# Patient Record
Sex: Male | Born: 1975 | Race: Black or African American | Hispanic: No | Marital: Single | Smoking: Never smoker
Health system: Southern US, Community
[De-identification: ages and names within clinical notes are randomized; demographics above are authoritative.]

---

## 1998-09-17 ENCOUNTER — Encounter: Payer: Self-pay | Admitting: Emergency Medicine

## 1998-09-17 ENCOUNTER — Emergency Department (HOSPITAL_COMMUNITY): Admission: EM | Admit: 1998-09-17 | Discharge: 1998-09-17 | Payer: Self-pay | Admitting: Emergency Medicine

## 1999-08-20 ENCOUNTER — Emergency Department (HOSPITAL_COMMUNITY): Admission: EM | Admit: 1999-08-20 | Discharge: 1999-08-20 | Payer: Self-pay | Admitting: Emergency Medicine

## 2003-10-23 ENCOUNTER — Emergency Department (HOSPITAL_COMMUNITY): Admission: EM | Admit: 2003-10-23 | Discharge: 2003-10-23 | Payer: Self-pay | Admitting: Emergency Medicine

## 2005-07-28 ENCOUNTER — Emergency Department (HOSPITAL_COMMUNITY): Admission: EM | Admit: 2005-07-28 | Discharge: 2005-07-28 | Payer: Self-pay | Admitting: Emergency Medicine

## 2017-04-08 ENCOUNTER — Emergency Department (HOSPITAL_COMMUNITY)
Admission: EM | Admit: 2017-04-08 | Discharge: 2017-04-08 | Disposition: A | Discharge status: Home / Self Care | Payer: Self-pay | Attending: Physician Assistant | Admitting: Physician Assistant

## 2017-04-08 ENCOUNTER — Encounter (HOSPITAL_COMMUNITY): Payer: Self-pay | Admitting: Emergency Medicine

## 2017-04-08 ENCOUNTER — Emergency Department (HOSPITAL_COMMUNITY): Payer: Self-pay

## 2017-04-08 ENCOUNTER — Other Ambulatory Visit: Payer: Self-pay

## 2017-04-08 DIAGNOSIS — B9789 Other viral agents as the cause of diseases classified elsewhere: Secondary | ICD-10-CM | POA: Insufficient documentation

## 2017-04-08 DIAGNOSIS — Z91030 Bee allergy status: Secondary | ICD-10-CM | POA: Insufficient documentation

## 2017-04-08 DIAGNOSIS — J029 Acute pharyngitis, unspecified: Secondary | ICD-10-CM | POA: Insufficient documentation

## 2017-04-08 DIAGNOSIS — Z209 Contact with and (suspected) exposure to unspecified communicable disease: Secondary | ICD-10-CM | POA: Insufficient documentation

## 2017-04-08 DIAGNOSIS — R0981 Nasal congestion: Secondary | ICD-10-CM | POA: Insufficient documentation

## 2017-04-08 DIAGNOSIS — R062 Wheezing: Secondary | ICD-10-CM | POA: Insufficient documentation

## 2017-04-08 DIAGNOSIS — J069 Acute upper respiratory infection, unspecified: Secondary | ICD-10-CM | POA: Insufficient documentation

## 2017-04-08 MED ORDER — PROMETHAZINE-DM 6.25-15 MG/5ML PO SYRP: 5.0000 mL | ORAL_SOLUTION | Freq: Four times a day (QID) | ORAL | 0 refills | Status: AC | PRN

## 2017-04-08 MED ORDER — SALINE SPRAY 0.65 % NA SOLN: 1.0000 | NASAL | 0 refills | Status: AC | PRN

## 2017-04-08 NOTE — ED Triage Notes (Signed)
Patient here with complaints of cough x2 weeks. Congestion. Reports that symptoms started out "like a cold" but go worse.

## 2017-04-08 NOTE — Discharge Instructions (Signed)
As discussed, make sure that you stay well-hydrated drinking plenty of fluids.  Use cough medication as needed for cough but do not drive while taking that medication.  Cough drops, throat sprays, tea with honey can be helpful to soothe your throat.  Normal saline nasal spray for congestion and reducing postnasal drip irritation. [facility] for [facility].  Return sooner if symptoms worsen, chest pain, difficulty breathing, or any other new concerning symptoms in the meantime.

## 2017-04-08 NOTE — ED Provider Notes (Signed)
[facility]-EMERGENCY DEPT Provider Note   CSN: [id number] Arrival date & time: 04/08/17  1654     History   Chief Complaint Chief Complaint  Patient presents with  . Cough    HPI Clement Sayresay L Defina is a 42 y.o. male no significant past medical history presenting with 2 weeks of upper respiratory infection with cough.  He reports that the congestion has been improving but he is still experiencing a cough for the last week that is "wheezing".  He reports known ill contacts but with shorter course of illness.  He has not received his flu shot this year.  Denies Fever, chills, nausea, vomiting, diarrhea, myalgias or other symptoms.  HPI  History reviewed. No pertinent past medical history.  There are no active problems to display for this patient.   History reviewed. No pertinent surgical history.     Home Medications    Prior to Admission medications   Medication Sig Start Date End Date Taking? Authorizing Provider  promethazine-dextromethorphan (PROMETHAZINE-DM) 6.25-15 MG/5ML syrup Take 5 mLs by mouth 4 (four) times daily as needed for cough. 04/08/17   Mathews RobinsonsMitchell, Alik Mawson B, PA-C  sodium chloride (OCEAN) 0.65 % SOLN nasal spray Place 1 spray into both nostrils as needed for congestion. 04/08/17   Georgiana ShoreMitchell, Messiah Ahr B, PA-C    Family History No family history on file.  Social History Social History   Tobacco Use  . Smoking status: Never Smoker  . Smokeless tobacco: Never Used  Substance Use Topics  . Alcohol use: Not on file  . Drug use: Not on file     Allergies   Bee venom   Review of Systems Review of Systems  Constitutional: Negative for chills and fever.  HENT: Positive for congestion and sore throat. Negative for facial swelling, sinus pressure, sinus pain, tinnitus, trouble swallowing and voice change.   Respiratory: Positive for cough and wheezing. Negative for choking, chest tightness, shortness of breath and stridor.   Cardiovascular:  Negative for chest pain, palpitations and leg swelling.  Gastrointestinal: Negative for nausea and vomiting.  Musculoskeletal: Negative for arthralgias, myalgias, neck pain and neck stiffness.  Skin: Negative for color change and pallor.  Neurological: Negative for dizziness, light-headedness and headaches.     Physical Exam Updated Vital Signs BP 137/86 (BP Location: Left Arm)   Pulse 81   Temp 98.8 F (37.1 C) (Oral)   Resp 18   SpO2 98%   Physical Exam  Constitutional: He appears well-developed and well-nourished. No distress.  Afebrile, nontoxic-appearing, sitting comfortably in chair in no acute distress.  HENT:  Head: Normocephalic and atraumatic.  Right Ear: External ear normal.  Left Ear: External ear normal.  Mouth/Throat: Oropharynx is clear and moist. No oropharyngeal exudate.  Eyes: Conjunctivae and EOM are normal. Right eye exhibits no discharge. Left eye exhibits no discharge.  Neck: Normal range of motion. Neck supple.  Cardiovascular: Normal rate, regular rhythm and normal heart sounds.  No murmur heard. Pulmonary/Chest: Effort normal and breath sounds normal. No stridor. No respiratory distress. He has no wheezes. He has no rales.  Lungs CTA bilaterally  Musculoskeletal: Normal range of motion. He exhibits no edema.  Neurological: He is alert.  Skin: Skin is warm and dry. He is not diaphoretic. No erythema. No pallor.  Psychiatric: He has a normal mood and affect.  Nursing note and vitals reviewed.    ED Treatments / Results  Labs (all labs ordered are listed, but only abnormal results are displayed) Labs  Reviewed - No data to display  EKG  EKG Interpretation None       Radiology Dg Chest 2 View  Result Date: 04/08/2017 CLINICAL DATA:  42 year old male with history of cough for the past 2 weeks. Chest congestion. EXAM: CHEST  2 VIEW COMPARISON:  No priors. FINDINGS: Lung volumes are normal. No consolidative airspace disease. No pleural effusions.  No pneumothorax. No pulmonary nodule or mass noted. Pulmonary vasculature and the cardiomediastinal silhouette are within normal limits. Ext field No radiographic evidence of acute cardiopulmonary disease. IMPRESSION: No active cardiopulmonary disease. Electronically Signed   By: Trudie Reed M.D.   On: 04/08/2017 18:22    Procedures Procedures (including critical care time)  Medications Ordered in ED Medications - No data to display   Initial Impression / Assessment and Plan / ED Course  I have reviewed the triage vital signs and the nursing notes.  Pertinent labs & imaging results that were available during my care of the patient were reviewed by me and considered in my medical decision making (see chart for details).    Presenting with 2 weeks of upper respiratory infection with cough persisting.  Overall has been improving but still experiencing the cough which has been worse over the last week.  Has taken antihistamines and has experienced improvement in his congestion but has throat irritation and persistent cough.  Chest x-ray negative for pneumonia or acute cardiopulmonary abnormalities.  Reassuring exam, patient is well-appearing nontoxic and afebrile.  Will discharge home with symptomatic relief and close follow-up with PCP as needed.   Discussed strict return precautions and advised to return to the emergency department if experiencing any new or worsening symptoms. Instructions were understood and patient agreed with discharge plan.  Final Clinical Impressions(s) / ED Diagnoses   Final diagnoses:  Viral URI with cough    ED Discharge Orders        Ordered    sodium chloride (OCEAN) 0.65 % SOLN nasal spray  As needed     04/08/17 1955    promethazine-dextromethorphan (PROMETHAZINE-DM) 6.25-15 MG/5ML syrup  4 times daily PRN     04/08/17 1955       Gregary Cromer 04/08/17 2003    Mackuen, Cindee Salt, MD 04/08/17 2336

## 2017-04-08 NOTE — ED Notes (Signed)
Pt is alert and oriented x 4 and is verbally responsive. Pt reports that he has had a cough x 2 weeks , pt reports that he has discomfort to his neck and chest with coughing.

## 2019-02-06 ENCOUNTER — Other Ambulatory Visit: Payer: Self-pay

## 2019-02-06 DIAGNOSIS — Z20822 Contact with and (suspected) exposure to covid-19: Secondary | ICD-10-CM

## 2019-02-07 LAB — NOVEL CORONAVIRUS, NAA: SARS-CoV-2, NAA: NOT DETECTED

## 2019-02-10 ENCOUNTER — Telehealth: Payer: Self-pay | Admitting: General Practice

## 2019-02-10 NOTE — Telephone Encounter (Signed)
Negative COVID results given. Patient results "NOT Detected." Caller expressed understanding. ° °

## 2019-02-27 ENCOUNTER — Telehealth: Payer: Self-pay | Admitting: Family

## 2019-02-27 DIAGNOSIS — R0600 Dyspnea, unspecified: Secondary | ICD-10-CM

## 2019-02-27 NOTE — Progress Notes (Signed)
  E-Visit for [employer] Virus Screening  Based on what you have shared with me, you need to seek an evaluation for a severe illness that is causing your symptoms which may be coronavirus or some other illness. I recommend that you be seen and evaluated "face to face". If you are considered high risk for Corona virus because of a known exposure, fever, shortness of breath and cough, OR if you have severe symptoms of any kind, seek medical care at an emergency room. Our Emergency Departments are best equipped to handle patients with severe symptoms.  You will be evaluated by the ER provider (or higher level of care provider) who will determine whether you need formal testing. Shortness of breath is concerning. You may need an xray  If you are having a true medical emergency please call 911.   I recommend the following:  . [facility] Emergency Department [place], [place], [place] [address] [phone]  . [facility] Emergency Department [place], [place], [place] [address] [phone]  . [facility] Emergency Department [place], [place], [place] [address] [phone]  . [facility] Emergency Department [address] [place], [place], [place] [address] [phone]  . [facility] Emergency Department [place], [place],  [address] [phone]  NOTE: If you entered your credit card information for this eVisit, you will not be charged. You may see a "hold" on your card for the $35 but that hold will drop off and you will not have a charge processed.   Your e-visit answers were reviewed by a board certified advanced clinical practitioner to complete your personal care plan.  Thank you for using e-Visits.

## 2019-02-28 ENCOUNTER — Other Ambulatory Visit: Payer: Self-pay

## 2019-02-28 DIAGNOSIS — Z20822 Contact with and (suspected) exposure to covid-19: Secondary | ICD-10-CM

## 2019-03-01 LAB — NOVEL CORONAVIRUS, NAA: SARS-CoV-2, NAA: NOT DETECTED

## 2020-05-30 ENCOUNTER — Other Ambulatory Visit: Payer: Self-pay

## 2020-05-30 ENCOUNTER — Emergency Department (HOSPITAL_COMMUNITY): Payer: 59

## 2020-05-30 ENCOUNTER — Emergency Department (HOSPITAL_COMMUNITY)
Admission: EM | Admit: 2020-05-30 | Discharge: 2020-05-30 | Disposition: A | Discharge status: Home / Self Care | Payer: 59 | Attending: Emergency Medicine | Admitting: Emergency Medicine

## 2020-05-30 ENCOUNTER — Encounter (HOSPITAL_COMMUNITY): Payer: Self-pay

## 2020-05-30 DIAGNOSIS — Z23 Encounter for immunization: Secondary | ICD-10-CM | POA: Diagnosis not present

## 2020-05-30 DIAGNOSIS — Y990 Civilian activity done for income or pay: Secondary | ICD-10-CM | POA: Insufficient documentation

## 2020-05-30 DIAGNOSIS — W268XXA Contact with other sharp object(s), not elsewhere classified, initial encounter: Secondary | ICD-10-CM | POA: Insufficient documentation

## 2020-05-30 DIAGNOSIS — S6991XA Unspecified injury of right wrist, hand and finger(s), initial encounter: Secondary | ICD-10-CM | POA: Diagnosis present

## 2020-05-30 DIAGNOSIS — S61411A Laceration without foreign body of right hand, initial encounter: Secondary | ICD-10-CM | POA: Insufficient documentation

## 2020-05-30 MED ORDER — TETANUS-DIPHTH-ACELL PERTUSSIS 5-2.5-18.5 LF-MCG/0.5 IM SUSY
0.5000 mL | PREFILLED_SYRINGE | Freq: Once | INTRAMUSCULAR | Status: AC
  Administered 2020-05-30: 0.5 mL via INTRAMUSCULAR
  Filled 2020-05-30: qty 0.5

## 2020-05-30 MED ORDER — LIDOCAINE-EPINEPHRINE (PF) 2 %-1:200000 IJ SOLN
10.0000 mL | Freq: Once | INTRAMUSCULAR | Status: AC
  Administered 2020-05-30: 10 mL
  Filled 2020-05-30: qty 20

## 2020-05-30 NOTE — Discharge Instructions (Addendum)
1. Medications: Tylenol or ibuprofen for pain 2. Treatment: ice for swelling, keep wound clean with warm soap and water and keep bandage dry 3. Follow Up: Please return in 10-14 days to have your stitches removed or sooner if you have concerns. Return to the emergency department for increased redness, drainage of pus from the wound   WOUND CARE  Remove bandage and wash wound gently with mild soap and warm water daily. Reapply a new bandage after cleaning wound  Continue daily cleansing with soap and water until stitches are removed.  Do not apply any ointments or creams to the wound while stitches are in place, as this may cause delayed healing. Return if you experience any of the following signs of infection: Swelling, redness, pus drainage, streaking, fever >101.0 F  Return if you experience excessive bleeding that does not stop after 15-20 minutes of constant, firm pressure.

## 2020-05-30 NOTE — ED Triage Notes (Signed)
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 Laceration approx 3cm.

## 2020-05-30 NOTE — ED Provider Notes (Signed)
[facility]-EMERGENCY DEPT Provider Note   CSN: [id number] Arrival date & time: 05/30/20  1911     History Chief Complaint  Patient presents with   Extremity Laceration    Roy Hopkins is a 45 y.o. male presenting for evaluation of right hand laceration.  Patient states around 1:00 this afternoon, approximately 6 hours prior to arrival he was at work opening something that was stuck and sustained a cut on the palm of his right hand.  He applied a dressing and "an antibiotic cream", and continued to work.  When he got home, area was still bleeding, prompting him to come to the ER.  He reports throbbing at the site.  No numbness or tingling.  No injury elsewhere.  He has no medical problems, takes medications daily.  He does not know when his last tetanus shot was.  He has not taken anything for pain.  Pain does not radiate anywhere.  HPI     History reviewed. No pertinent past medical history.  There are no problems to display for this patient.   History reviewed. No pertinent surgical history.     No family history on file.  Social History   Tobacco Use   Smoking status: Never Smoker   Smokeless tobacco: Never Used    Home Medications Prior to Admission medications   Medication Sig Start Date End Date Taking? Authorizing Provider  promethazine-dextromethorphan (PROMETHAZINE-DM) 6.25-15 MG/5ML syrup Take 5 mLs by mouth 4 (four) times daily as needed for cough. 04/08/17   Mathews Robinsons B, PA-C  sodium chloride (OCEAN) 0.65 % SOLN nasal spray Place 1 spray into both nostrils as needed for congestion. 04/08/17   Georgiana Shore, PA-C    Allergies    Bee venom  Review of Systems   Review of Systems  Skin: Positive for wound.  Neurological: Negative for numbness.    Physical Exam Updated Vital Signs BP (!) 168/119 (BP Location: Left Arm)    Pulse 85    Temp 98.1 F (36.7 C) (Oral)    Resp 18    SpO2 95%   Physical Exam Vitals and  nursing note reviewed.  Constitutional:      General: He is not in acute distress.    Appearance: He is well-developed and well-nourished.  HENT:     Head: Normocephalic and atraumatic.  Eyes:     Extraocular Movements: EOM normal.  Pulmonary:     Effort: Pulmonary effort is normal.  Abdominal:     General: There is no distension.  Musculoskeletal:        General: Normal range of motion.     Cervical back: Normal range of motion.     Comments: 3 cm c-shaped laceration of the hypothenar eminence of the R hand.  Approximately half a centimeter deep.  No active bleeding. Full active range of motion of the fingers of the right hand without difficulty.  Full active range of motion of the wrist.  Right pinky finger with full extension and flexion of each joint when held in isolation.  Skin:    General: Skin is warm.     Capillary Refill: Capillary refill takes less than 2 seconds.     Findings: No rash.  Neurological:     Mental Status: He is alert and oriented to person, place, and time.  Psychiatric:        Mood and Affect: Mood and affect normal.     ED Results / Procedures / Treatments  Labs (all labs ordered are listed, but only abnormal results are displayed) Labs Reviewed - No data to display  EKG None  Radiology DG Hand Complete Right  Result Date: 05/30/2020 CLINICAL DATA:  Laceration to the hand. EXAM: RIGHT HAND - COMPLETE 3+ VIEW COMPARISON:  None. FINDINGS: There is no evidence of fracture or dislocation. There is no evidence of arthropathy or other focal bone abnormality. Soft tissues are unremarkable. IMPRESSION: Negative. Electronically Signed   By: Katherine Mantle M.D.   On: 05/30/2020 20:02    Procedures .Marland KitchenLaceration Repair  Date/Time: 05/30/2020 9:56 PM Performed by: Alveria Apley, PA-C Authorized by: Alveria Apley, PA-C   Consent:    Consent obtained:  Verbal   Consent given by:  Patient   Risks discussed:  Infection, need for additional  repair, poor wound healing, poor cosmetic result, pain and nerve damage Universal protocol:    Patient identity confirmed:  Verbally with patient Anesthesia:    Anesthesia method:  Local infiltration   Local anesthetic:  Lidocaine 2% WITH epi Laceration details:    Location:  Hand   Hand location:  R palm   Length (cm):  3   Depth (mm):  5 Pre-procedure details:    Preparation:  Patient was prepped and draped in usual sterile fashion and imaging obtained to evaluate for foreign bodies Exploration:    Wound exploration: wound explored through full range of motion and entire depth of wound visualized     Wound extent: no underlying fracture noted   Treatment:    Area cleansed with:  Saline   Amount of cleaning:  Extensive   Irrigation solution:  Sterile water   Irrigation method:  Syringe Skin repair:    Repair method:  Sutures   Suture size:  4-0   Suture technique:  Simple interrupted   Number of sutures:  6 Approximation:    Approximation:  Close Repair type:    Repair type:  Simple Post-procedure details:    Dressing:  Sterile dressing   Procedure completion:  Tolerated well, no immediate complications     Medications Ordered in ED Medications  lidocaine-EPINEPHrine (XYLOCAINE W/EPI) 2 %-1:200000 (PF) injection 10 mL (has no administration in time range)  Tdap (BOOSTRIX) injection 0.5 mL (0.5 mLs Intramuscular Given 05/30/20 1942)    ED Course  I have reviewed the triage vital signs and the nursing notes.  Pertinent labs & imaging results that were available during my care of the patient were reviewed by me and considered in my medical decision making (see chart for details).    MDM Rules/Calculators/A&P                          Patient presenting for evaluation of right hand laceration.  On exam, patient is neurovascularly intact.  However laceration is fairly deep, obtain x-rays to ensure no foreign body or bony abnormality.  X-ray viewed interpreted by me, no  fracture dislocation.  Laceration repaired as described above.  Tetanus shot updated.  Aftercare instructions given.  At this time, patient appears safe for discharge.  Return precautions given.  Patient states she understands and agrees to plan.  Final Clinical Impression(s) / ED Diagnoses Final diagnoses:  Laceration of right hand without foreign body, initial encounter    Rx / DC Orders ED Discharge Orders    None       Drema Balzarine 05/30/20 2159    Cheryll Cockayne, MD 05/30/20 2251

## 2020-05-30 NOTE — ED Notes (Signed)
Sophia, PA at bedside.

## 2020-06-11 ENCOUNTER — Other Ambulatory Visit: Payer: Self-pay

## 2020-06-11 ENCOUNTER — Ambulatory Visit (HOSPITAL_COMMUNITY)
Admission: EM | Admit: 2020-06-11 | Discharge: 2020-06-11 | Disposition: A | Discharge status: Home / Self Care | Payer: 59 | Attending: Internal Medicine | Admitting: Internal Medicine

## 2020-06-11 DIAGNOSIS — Z4802 Encounter for removal of sutures: Secondary | ICD-10-CM

## 2020-06-11 NOTE — ED Notes (Signed)
Dr. Tracie Harrier into room to assess pt sutures. Nurse advised to remove.  5 sutures removed

## 2020-06-11 NOTE — ED Triage Notes (Signed)
Pt in for suture removal .

## 2021-06-15 ENCOUNTER — Other Ambulatory Visit: Payer: Self-pay

## 2021-06-15 ENCOUNTER — Encounter (HOSPITAL_COMMUNITY): Payer: Self-pay

## 2021-06-15 ENCOUNTER — Emergency Department (HOSPITAL_COMMUNITY)
Admission: EM | Admit: 2021-06-15 | Discharge: 2021-06-16 | Disposition: A | Discharge status: Home / Self Care | Payer: Self-pay | Attending: Emergency Medicine | Admitting: Emergency Medicine

## 2021-06-15 DIAGNOSIS — Y990 Civilian activity done for income or pay: Secondary | ICD-10-CM | POA: Insufficient documentation

## 2021-06-15 DIAGNOSIS — X500XXA Overexertion from strenuous movement or load, initial encounter: Secondary | ICD-10-CM | POA: Insufficient documentation

## 2021-06-15 DIAGNOSIS — M7712 Lateral epicondylitis, left elbow: Secondary | ICD-10-CM | POA: Insufficient documentation

## 2021-06-15 NOTE — ED Triage Notes (Signed)
L elbow pain "for a while" that has worsened over the past few months. Pt has tried OTC meds with no relief. Full ROM intact. ?

## 2021-06-16 ENCOUNTER — Emergency Department (HOSPITAL_COMMUNITY): Payer: Self-pay

## 2021-06-16 MED ORDER — IBUPROFEN 600 MG PO TABS: 600.0000 mg | ORAL_TABLET | Freq: Four times a day (QID) | ORAL | 0 refills | Status: AC | PRN

## 2021-06-16 MED ORDER — DICLOFENAC SODIUM 1 % EX GEL: 2.0000 g | Freq: Four times a day (QID) | CUTANEOUS | 0 refills | Status: AC

## 2021-06-16 NOTE — ED Provider Notes (Signed)
?[facility]-EMERGENCY DEPT ?[facility] Emergency Department ?Provider Note ?MRN:  [id number]  ?Arrival date & time: 06/16/21    ? ?Chief Complaint   ?Elbow Pain ?  ?History of Present Illness   ?Roy Hopkins is a 46 y.o. year-old male presents to the ED with chief complaint of left elbow pain.  He states that he does a lot of lifting and climbing for work.  He thinks this is how he injured his elbow.  He denies any trauma.  Has tried OTC medications without relief.  He states that the elbow pain has been gradually worsening for quite some time.. ? ?History provided by patient. ? ? ?Review of Systems  ?Pertinent review of systems noted in HPI.  ? ? ?Physical Exam  ? ?Vitals:  ? 06/15/21 2326  ?BP: (!) 156/114  ?Pulse: 72  ?Resp: 20  ?Temp: 98 ?F (36.7 ?C)  ?SpO2: 96%  ?  ?CONSTITUTIONAL:  well-appearing, NAD ?NEURO:  Alert and oriented x 3, CN 3-12 grossly intact ?EYES:  eyes equal and reactive ?ENT/NECK:  Supple, no stridor  ?CARDIO:  appears well-perfused  ?PULM:  No respiratory distress,  ?GI/GU:  non-distended,  ?MSK/SPINE:  No gross deformities, no edema, moves all extremities, tenderness to palpation over lateral epicondyles, no evidence of abscess, normal range of motion and strength of the left elbow, there is increased pain with resisted pronation ?SKIN:  no rash, atraumatic ? ? ?*Additional and/or pertinent findings included in MDM below ? ?Diagnostic and Interventional Summary  ? ? ?Labs Reviewed - No data to display  ?DG Elbow Complete Left  ?Final Result  ?  ?  ?Medications - No data to display  ? ?Procedures  /  Critical Care ?Procedures ? ?ED Course and Medical Decision Making  ?I have reviewed the triage vital signs, the nursing notes, and pertinent available records from the EMR. ? ?Complexity of Problems Addressed: ?Moderate Complexity: Acute complicated illness or injury, requiring diagnostic workup as ordered and performed below. ?Comorbidities affecting this illness/injury include: ?None ?Social  Determinants Affecting Care: ?Complexity of care is increased due to access to medical care. ? ? ?ED Course: ?After considering the following differential, infection, gout, tendinitis, I agree with work-up ordered in triage. ?I visualized the elbow x-ray which is notable for no fracture or dislocation and agree with the radiologist interpretation.. ? ?  ? ?Consultants: ?No consultations were needed in caring for this patient. ? ?Treatment and Plan: ?Treat with NSAIDs, rest, ice, and an elbow strap.  Encourage patient to follow-up with physical therapy or sports medicine if not improving. ? ?Emergency department workup does not suggest an emergent condition requiring admission or immediate intervention beyond  what has been performed at this time. The patient is safe for discharge and has  been instructed to return immediately for worsening symptoms, change in  symptoms or any other concerns ? ? ? ?Final Clinical Impressions(s) / ED Diagnoses  ? ?  ICD-10-CM   ?1. Lateral epicondylitis of left elbow  M77.12   ?  ?  ?ED Discharge Orders   ? ?      Ordered  ?  ibuprofen (ADVIL) 600 MG tablet  Every 6 hours PRN       ? 06/16/21 0242  ?  diclofenac Sodium (VOLTAREN) 1 % GEL  4 times daily       ? 06/16/21 0242  ? ?  ?  ? ?  ?  ? ? ?Discharge Instructions Discussed with and Provided to Patient:  ? ? ? ?  Discharge Instructions   ? ?  ?I recommend getting Tennis Elbow strap.  Also, use ice massage for 7 minutes in the morning and evening.  This can take a long time to heal completely.  You may need to see sports medicine or physical therapy. ? ? ? ? ?  ?Roxy Horseman, PA-C ?06/16/21 0246 ? ?  ?Tilden Fossa, MD ?06/16/21 [phone] ? ?

## 2021-06-16 NOTE — Discharge Instructions (Addendum)
I recommend getting Tennis Elbow strap.  Also, use ice massage for 7 minutes in the morning and evening.  This can take a long time to heal completely.  You may need to see sports medicine or physical therapy. ?

## 2022-01-07 ENCOUNTER — Other Ambulatory Visit (HOSPITAL_BASED_OUTPATIENT_CLINIC_OR_DEPARTMENT_OTHER): Payer: Self-pay

## 2022-01-07 DIAGNOSIS — G4733 Obstructive sleep apnea (adult) (pediatric): Secondary | ICD-10-CM

## 2023-01-27 DIAGNOSIS — Z0001 Encounter for general adult medical examination with abnormal findings: Secondary | ICD-10-CM | POA: Diagnosis not present

## 2023-01-27 DIAGNOSIS — G4701 Insomnia due to medical condition: Secondary | ICD-10-CM | POA: Diagnosis not present

## 2023-01-27 DIAGNOSIS — I10 Essential (primary) hypertension: Secondary | ICD-10-CM | POA: Diagnosis not present

## 2023-01-27 DIAGNOSIS — M25561 Pain in right knee: Secondary | ICD-10-CM | POA: Diagnosis not present

## 2023-01-27 DIAGNOSIS — F419 Anxiety disorder, unspecified: Secondary | ICD-10-CM | POA: Diagnosis not present

## 2023-01-29 DIAGNOSIS — Z125 Encounter for screening for malignant neoplasm of prostate: Secondary | ICD-10-CM | POA: Diagnosis not present

## 2023-01-29 DIAGNOSIS — I10 Essential (primary) hypertension: Secondary | ICD-10-CM | POA: Diagnosis not present

## 2023-01-29 DIAGNOSIS — R7303 Prediabetes: Secondary | ICD-10-CM | POA: Diagnosis not present

## 2023-01-29 DIAGNOSIS — Z136 Encounter for screening for cardiovascular disorders: Secondary | ICD-10-CM | POA: Diagnosis not present

## 2023-02-12 ENCOUNTER — Other Ambulatory Visit: Payer: Self-pay | Admitting: Family Medicine

## 2023-02-12 ENCOUNTER — Ambulatory Visit
Admission: RE | Admit: 2023-02-12 | Discharge: 2023-02-12 | Disposition: A | Discharge status: Home / Self Care | Payer: BC Managed Care – PPO | Source: Ambulatory Visit | Attending: Family Medicine | Admitting: Family Medicine

## 2023-02-12 DIAGNOSIS — M25561 Pain in right knee: Secondary | ICD-10-CM

## 2023-02-12 DIAGNOSIS — Z91030 Bee allergy status: Secondary | ICD-10-CM | POA: Diagnosis not present

## 2023-02-12 DIAGNOSIS — G8929 Other chronic pain: Secondary | ICD-10-CM

## 2023-02-12 DIAGNOSIS — M1711 Unilateral primary osteoarthritis, right knee: Secondary | ICD-10-CM | POA: Diagnosis not present

## 2023-02-12 DIAGNOSIS — E7849 Other hyperlipidemia: Secondary | ICD-10-CM | POA: Diagnosis not present

## 2023-02-12 DIAGNOSIS — I10 Essential (primary) hypertension: Secondary | ICD-10-CM | POA: Diagnosis not present

## 2023-04-09 DIAGNOSIS — M25561 Pain in right knee: Secondary | ICD-10-CM | POA: Diagnosis not present

## 2023-05-13 IMAGING — CR DG ELBOW COMPLETE 3+V*L*
4 series · 4 of 4 positions shown · non-contrast
Comparison: None.

CLINICAL DATA: Left elbow pain

EXAM:
LEFT ELBOW - COMPLETE 3+ VIEW

[x elbow ap left]
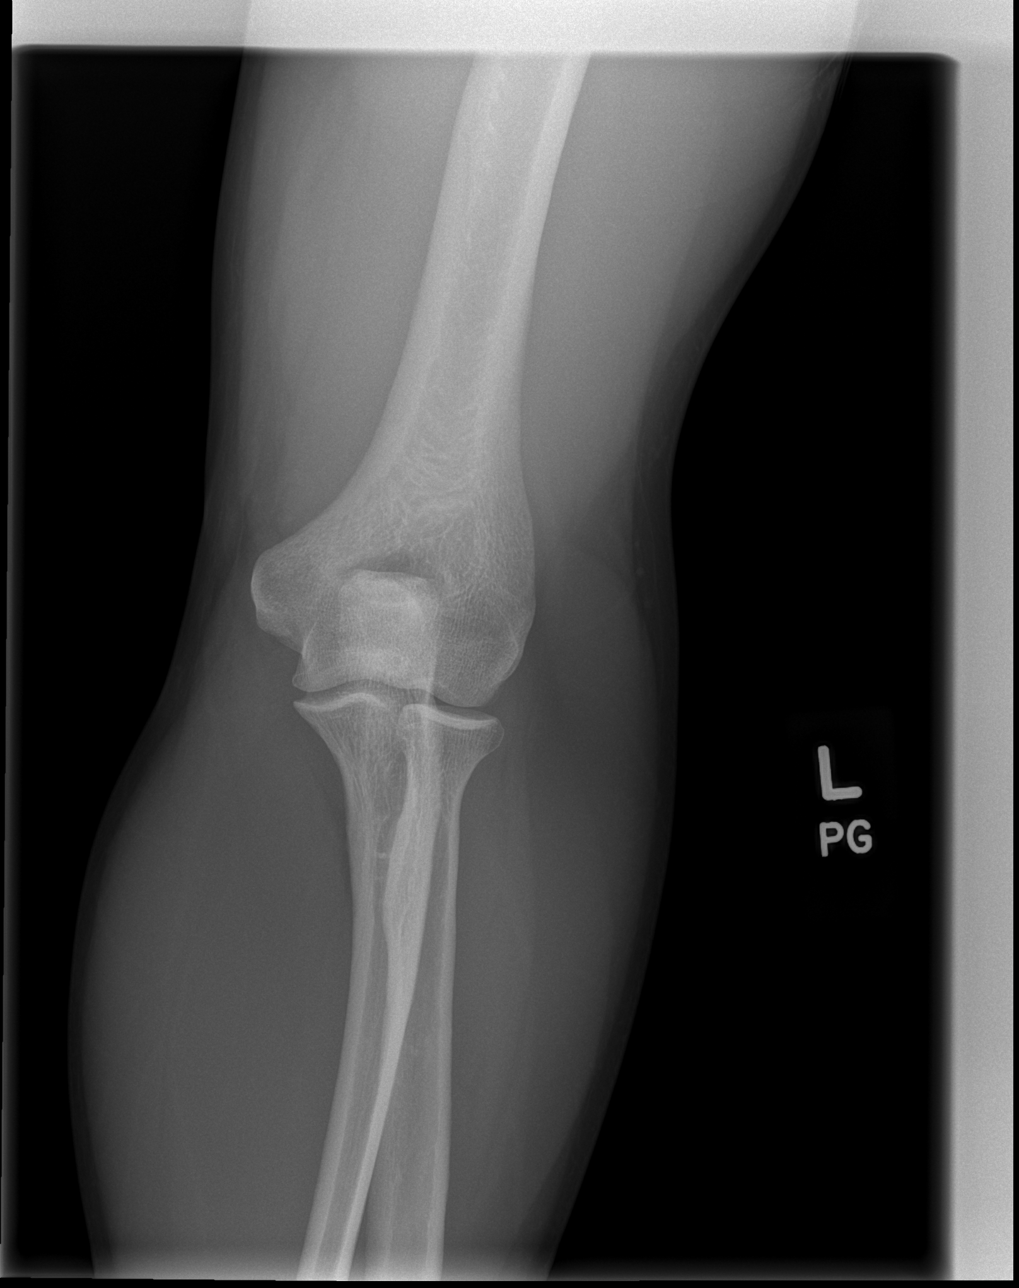

[x elbow obl left (1 of 2)]
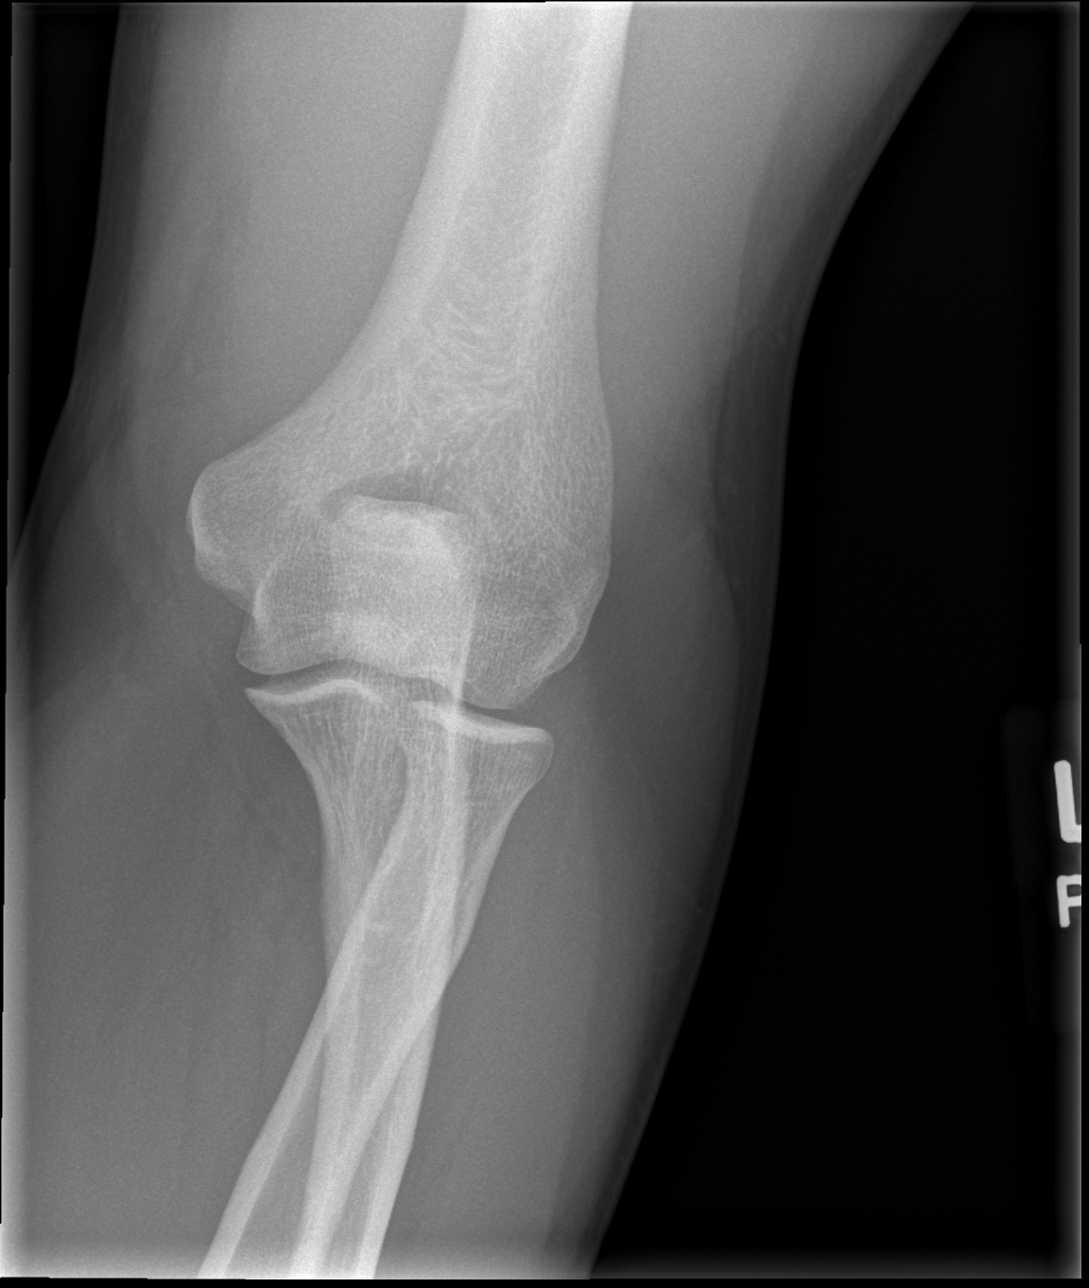

[x elbow obl left (2 of 2)]
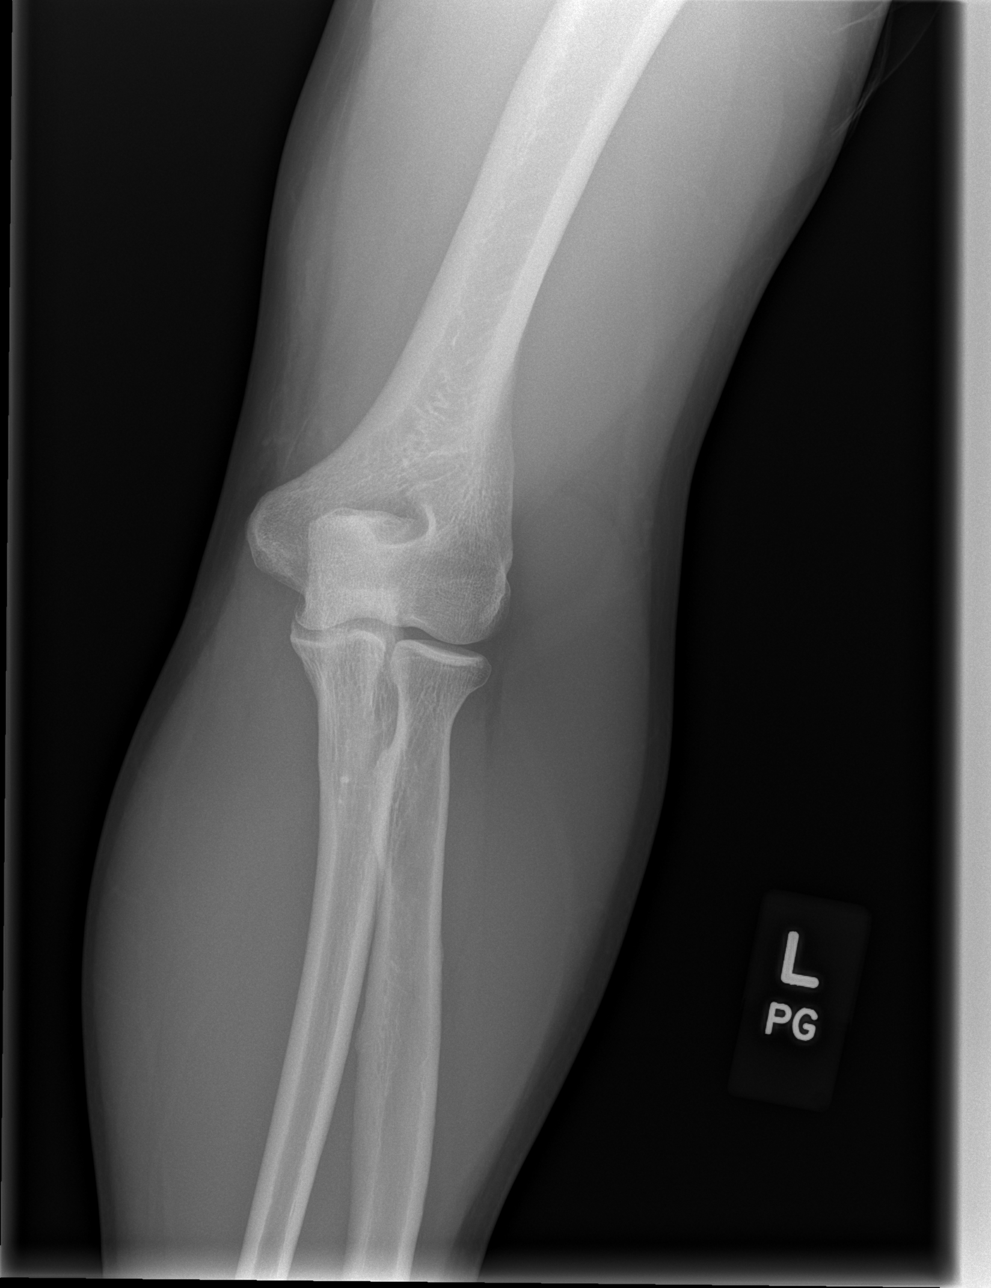

[x elbow lat left]
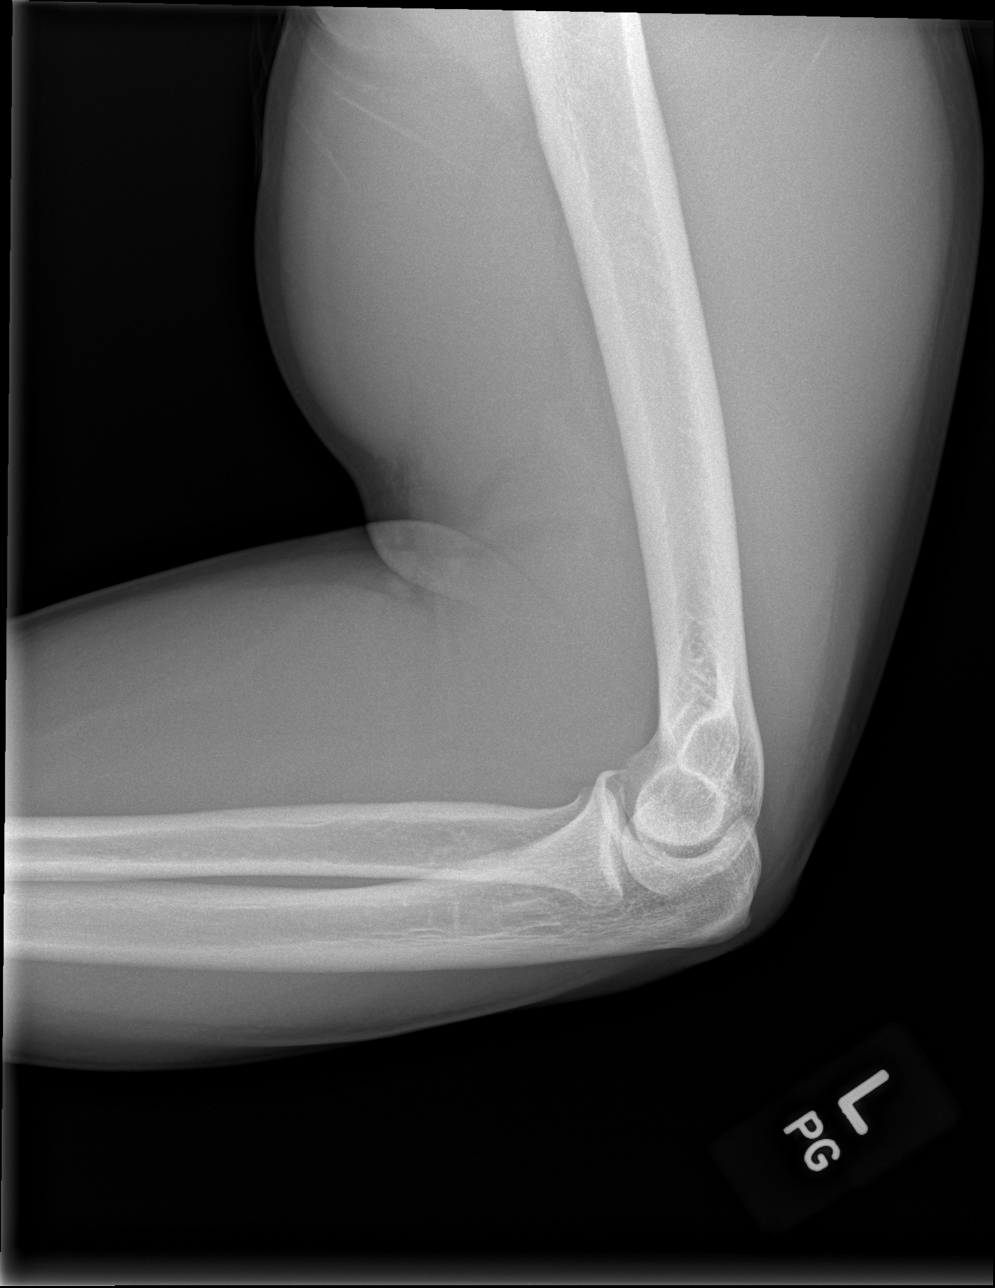

[4 of 4 positions shown; findings below may reference images not displayed]

FINDINGS: There is no evidence of fracture, dislocation, or joint effusion.
There is no evidence of arthropathy or other focal bone abnormality.
Soft tissues are unremarkable.
IMPRESSION: Negative.

## 2024-02-29 NOTE — Progress Notes (Signed)
 Roy Hopkins                                          MRN: [id number]   02/29/2024   The [facility] Quality Team Specialist reviewed this patient medical record for the purposes of chart review for care gap closure. The following were reviewed: chart review for care gap closure-controlling blood pressure.    [facility] Quality Team
